# Patient Record
Sex: Female | Born: 1985 | Race: White | Hispanic: No | Marital: Married | Smoking: Current some day smoker
Health system: Southern US, Community
[De-identification: ages and names within clinical notes are randomized; demographics above are authoritative.]

## PROBLEM LIST (undated history)

## (undated) HISTORY — PX: CHOLECYSTECTOMY: SHX55

## (undated) HISTORY — PX: TUBAL LIGATION: SHX77

---

## 2016-01-06 ENCOUNTER — Emergency Department (HOSPITAL_COMMUNITY): Payer: 59

## 2016-01-06 ENCOUNTER — Emergency Department (HOSPITAL_COMMUNITY)
Admission: EM | Admit: 2016-01-06 | Discharge: 2016-01-06 | Disposition: A | Discharge status: Home / Self Care | Payer: 59 | Attending: Emergency Medicine | Admitting: Emergency Medicine

## 2016-01-06 ENCOUNTER — Encounter (HOSPITAL_COMMUNITY): Payer: Self-pay | Admitting: Emergency Medicine

## 2016-01-06 DIAGNOSIS — S59912A Unspecified injury of left forearm, initial encounter: Secondary | ICD-10-CM | POA: Diagnosis not present

## 2016-01-06 DIAGNOSIS — S199XXA Unspecified injury of neck, initial encounter: Secondary | ICD-10-CM | POA: Diagnosis not present

## 2016-01-06 DIAGNOSIS — S0990XA Unspecified injury of head, initial encounter: Secondary | ICD-10-CM | POA: Insufficient documentation

## 2016-01-06 DIAGNOSIS — Y92410 Unspecified street and highway as the place of occurrence of the external cause: Secondary | ICD-10-CM | POA: Insufficient documentation

## 2016-01-06 DIAGNOSIS — S50811A Abrasion of right forearm, initial encounter: Secondary | ICD-10-CM | POA: Diagnosis not present

## 2016-01-06 DIAGNOSIS — S3992XA Unspecified injury of lower back, initial encounter: Secondary | ICD-10-CM | POA: Insufficient documentation

## 2016-01-06 DIAGNOSIS — Y998 Other external cause status: Secondary | ICD-10-CM | POA: Insufficient documentation

## 2016-01-06 DIAGNOSIS — Y9389 Activity, other specified: Secondary | ICD-10-CM | POA: Insufficient documentation

## 2016-01-06 MED ORDER — BACITRACIN ZINC 500 UNIT/GM EX OINT: 1.0000 "application " | TOPICAL_OINTMENT | Freq: Two times a day (BID) | CUTANEOUS | Status: DC

## 2016-01-06 MED ORDER — IBUPROFEN 600 MG PO TABS: 600.0000 mg | ORAL_TABLET | Freq: Four times a day (QID) | ORAL | Status: AC | PRN

## 2016-01-06 MED ORDER — HYDROCODONE-ACETAMINOPHEN 5-325 MG PO TABS: 1.0000 | ORAL_TABLET | Freq: Four times a day (QID) | ORAL | Status: AC | PRN

## 2016-01-06 MED ORDER — CYCLOBENZAPRINE HCL 10 MG PO TABS: 10.0000 mg | ORAL_TABLET | Freq: Two times a day (BID) | ORAL | Status: AC | PRN
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
## 2016-01-28 ENCOUNTER — Emergency Department (HOSPITAL_COMMUNITY)
Admission: EM | Admit: 2016-01-28 | Discharge: 2016-01-28 | Disposition: A | Discharge status: Home / Self Care | Payer: 59 | Attending: Emergency Medicine | Admitting: Emergency Medicine

## 2016-01-28 ENCOUNTER — Encounter (HOSPITAL_COMMUNITY): Payer: Self-pay

## 2016-01-28 ENCOUNTER — Emergency Department (HOSPITAL_COMMUNITY): Payer: 59

## 2016-01-28 DIAGNOSIS — M545 Low back pain: Secondary | ICD-10-CM | POA: Insufficient documentation

## 2016-01-28 DIAGNOSIS — Z91040 Latex allergy status: Secondary | ICD-10-CM | POA: Insufficient documentation

## 2016-01-28 DIAGNOSIS — R51 Headache: Secondary | ICD-10-CM | POA: Diagnosis not present

## 2016-01-28 DIAGNOSIS — F17200 Nicotine dependence, unspecified, uncomplicated: Secondary | ICD-10-CM | POA: Diagnosis not present

## 2016-01-28 DIAGNOSIS — R519 Headache, unspecified: Secondary | ICD-10-CM

## 2016-01-28 DIAGNOSIS — M7918 Myalgia, other site: Secondary | ICD-10-CM

## 2016-01-28 LAB — BASIC METABOLIC PANEL
Anion gap: 6 (ref 5–15)
BUN: 9 mg/dL (ref 6–20)
CHLORIDE: 105 mmol/L (ref 101–111)
CO2: 27 mmol/L (ref 22–32)
Calcium: 9.3 mg/dL (ref 8.9–10.3)
Creatinine, Ser: 0.76 mg/dL (ref 0.44–1.00)
GFR calc Af Amer: >60 mL/min (ref >60)
GFR calc non Af Amer: >60 mL/min (ref >60)
Glucose, Bld: 87 mg/dL (ref 65–99)
POTASSIUM: 3.7 mmol/L (ref 3.5–5.1)
SODIUM: 138 mmol/L (ref 135–145)

## 2016-01-28 LAB — URINALYSIS, ROUTINE W REFLEX MICROSCOPIC
Bilirubin Urine: NEGATIVE
GLUCOSE, UA: NEGATIVE mg/dL
Hgb urine dipstick: NEGATIVE
Ketones, ur: NEGATIVE mg/dL
LEUKOCYTES UA: NEGATIVE
Nitrite: NEGATIVE
PH: 6.5 (ref 5.0–8.0)
Protein, ur: NEGATIVE mg/dL
Specific Gravity, Urine: 1.025 (ref 1.005–1.030)

## 2016-01-28 LAB — CBC
HEMATOCRIT: 37.3 % (ref 36.0–46.0)
Hemoglobin: 12 g/dL (ref 12.0–15.0)
MCH: 28.6 pg (ref 26.0–34.0)
MCHC: 32.2 g/dL (ref 30.0–36.0)
MCV: 89 fL (ref 78.0–100.0)
Platelets: 220 10*3/uL (ref 150–400)
RBC: 4.19 MIL/uL (ref 3.87–5.11)
RDW: 13.2 % (ref 11.5–15.5)
WBC: 7.2 10*3/uL (ref 4.0–10.5)

## 2016-01-28 MED ORDER — KETOROLAC TROMETHAMINE 15 MG/ML IJ SOLN
15.0000 mg | Freq: Once | INTRAMUSCULAR | Status: AC
  Administered 2016-01-28: 15 mg via INTRAVENOUS
  Filled 2016-01-28: qty 1

## 2016-01-28 MED ORDER — DIPHENHYDRAMINE HCL 50 MG/ML IJ SOLN
12.5000 mg | Freq: Once | INTRAMUSCULAR | Status: AC
  Administered 2016-01-28: 12.5 mg via INTRAVENOUS
  Filled 2016-01-28: qty 1

## 2016-01-28 MED ORDER — SODIUM CHLORIDE 0.9 % IV BOLUS (SEPSIS)
1000.0000 mL | Freq: Once | INTRAVENOUS | Status: AC
  Administered 2016-01-28: 1000 mL via INTRAVENOUS

## 2016-01-28 MED ORDER — METOCLOPRAMIDE HCL 5 MG/ML IJ SOLN
10.0000 mg | Freq: Once | INTRAMUSCULAR | Status: AC
  Administered 2016-01-28: 10 mg via INTRAVENOUS
  Filled 2016-01-28: qty 2

## 2016-01-28 NOTE — ED Notes (Signed)
Patient transported to CT 

## 2016-01-28 NOTE — Discharge Instructions (Signed)
Please read and follow all provided instructions.  Your diagnoses today include:  1. Nonintractable headache, unspecified chronicity pattern, unspecified headache type   2. Musculoskeletal pain    Tests performed today include:  CT of your head which was normal and did not show any serious cause of your headache  Vital signs. See below for your results today.   Medications:  In the Emergency Department you received:  Reglan - antinausea/headache medication  Benadryl - antihistamine to counteract potential side effects of reglan  Toradol - NSAID medication similar to ibuprofen  Take any prescribed medications only as directed.  Additional information:  Follow any educational materials contained in this packet.  You are having a headache. No specific cause was found today for your headache. It may have been a migraine or other cause of headache. Stress, anxiety, fatigue, and depression are common triggers for headaches.   Your headache today does not appear to be life-threatening or require hospitalization, but often the exact cause of headaches is not determined in the emergency department. Therefore, follow-up with your doctor is very important to find out what may have caused your headache and whether or not you need any further diagnostic testing or treatment.   Sometimes headaches can appear benign (not harmful), but then more serious symptoms can develop which should prompt an immediate re-evaluation by your doctor or the emergency department.  BE VERY CAREFUL not to take multiple medicines containing Tylenol (also called acetaminophen). Doing so can lead to an overdose which can damage your liver and cause liver failure and possibly death.   Follow-up instructions: Please follow-up with your primary care provider in the next 3 days for further evaluation of your symptoms.   Return instructions:   Please return to the Emergency Department if you experience worsening  symptoms.  Return if the medications do not resolve your headache, if it recurs, or if you have multiple episodes of vomiting or cannot keep down fluids.  Return if you have a change from the usual headache.  RETURN IMMEDIATELY IF you:  Develop a sudden, severe headache  Develop confusion or become poorly responsive or faint  Develop a fever above 100.30F or problem breathing  Have a change in speech, vision, swallowing, or understanding  Develop new weakness, numbness, tingling, incoordination in your arms or legs  Have a seizure  Please return if you have any other emergent concerns.  Additional Information:  Your vital signs today were: BP 114/82 mmHg   Pulse 71   Temp(Src) 98 F (36.7 C) (Oral)   Resp 12   Ht 5' (1.524 m)   Wt 77.792 kg   BMI 33.49 kg/m2   SpO2 100%   LMP 01/03/2016 If your blood pressure (BP) was elevated above 135/85 this visit, please have this repeated by your doctor within one month. --------------
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
